# Patient Record
Sex: Male | Born: 1995 | Race: Black or African American | Hispanic: No | Marital: Single | Smoking: Never smoker
Health system: Southern US, Community
[De-identification: ages and names within clinical notes are randomized; demographics above are authoritative.]

---
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
## 2016-11-17 ENCOUNTER — Emergency Department: Payer: Self-pay

## 2016-11-17 ENCOUNTER — Emergency Department
Admission: EM | Admit: 2016-11-17 | Discharge: 2016-11-17 | Disposition: A | Discharge status: Home / Self Care | Payer: Self-pay | Attending: Emergency Medicine | Admitting: Emergency Medicine

## 2016-11-17 ENCOUNTER — Encounter: Payer: Self-pay | Admitting: Emergency Medicine

## 2016-11-17 DIAGNOSIS — J36 Peritonsillar abscess: Secondary | ICD-10-CM | POA: Insufficient documentation

## 2016-11-17 LAB — CBC WITH DIFFERENTIAL/PLATELET
BASOS PCT: 0 %
Basophils Absolute: 0 10*3/uL (ref 0–0.1)
EOS ABS: 0.2 10*3/uL (ref 0–0.7)
Eosinophils Relative: 1 %
HEMATOCRIT: 46.7 % (ref 40.0–52.0)
Hemoglobin: 15.8 g/dL (ref 13.0–18.0)
LYMPHS PCT: 18 %
Lymphs Abs: 2.8 10*3/uL (ref 1.0–3.6)
MCH: 29.2 pg (ref 26.0–34.0)
MCHC: 33.8 g/dL (ref 32.0–36.0)
MCV: 86.5 fL (ref 80.0–100.0)
MONO ABS: 2.7 10*3/uL — AB (ref 0.2–1.0)
Monocytes Relative: 17 %
NEUTROS ABS: 10.1 10*3/uL — AB (ref 1.4–6.5)
Neutrophils Relative %: 64 %
PLATELETS: 167 10*3/uL (ref 150–440)
RBC: 5.4 MIL/uL (ref 4.40–5.90)
RDW: 14 % (ref 11.5–14.5)
WBC: 15.8 10*3/uL — ABNORMAL HIGH (ref 3.8–10.6)

## 2016-11-17 LAB — POCT RAPID STREP A: STREPTOCOCCUS, GROUP A SCREEN (DIRECT): NEGATIVE

## 2016-11-17 LAB — BASIC METABOLIC PANEL
Anion gap: 12 (ref 5–15)
BUN: 13 mg/dL (ref 6–20)
CHLORIDE: 101 mmol/L (ref 101–111)
CO2: 24 mmol/L (ref 22–32)
Calcium: 9.3 mg/dL (ref 8.9–10.3)
Creatinine, Ser: 0.94 mg/dL (ref 0.61–1.24)
GFR calc non Af Amer: >60 mL/min (ref >60)
Glucose, Bld: 105 mg/dL — ABNORMAL HIGH (ref 65–99)
POTASSIUM: 3.4 mmol/L — AB (ref 3.5–5.1)
SODIUM: 137 mmol/L (ref 135–145)

## 2016-11-17 MED ORDER — CLINDAMYCIN PHOSPHATE 600 MG/50ML IV SOLN
INTRAVENOUS | Status: AC
  Administered 2016-11-17: 600 mg via INTRAVENOUS
  Filled 2016-11-17: qty 50

## 2016-11-17 MED ORDER — LIDOCAINE HCL (PF) 1 % IJ SOLN
INTRAMUSCULAR | Status: AC
  Administered 2016-11-17: 4.2 mL
  Filled 2016-11-17: qty 5

## 2016-11-17 MED ORDER — KETOROLAC TROMETHAMINE 30 MG/ML IJ SOLN
INTRAMUSCULAR | Status: AC
  Administered 2016-11-17: 30 mg via INTRAVENOUS
  Filled 2016-11-17: qty 1

## 2016-11-17 MED ORDER — METHYLPREDNISOLONE SODIUM SUCC 125 MG IJ SOLR
125.0000 mg | Freq: Once | INTRAMUSCULAR | Status: AC
  Administered 2016-11-17: 125 mg via INTRAVENOUS

## 2016-11-17 MED ORDER — METHYLPREDNISOLONE ACETATE 80 MG/ML IJ SUSP
80.0000 mg | Freq: Once | INTRAMUSCULAR | Status: AC
  Administered 2016-11-17: 80 mg via INTRAMUSCULAR
  Filled 2016-11-17 (×2): qty 1

## 2016-11-17 MED ORDER — PREDNISOLONE SODIUM PHOSPHATE 15 MG/5ML PO SOLN
60.0000 mg | Freq: Once | ORAL | Status: AC
  Administered 2016-11-17: 60 mg via ORAL
  Filled 2016-11-17: qty 20

## 2016-11-17 MED ORDER — CLINDAMYCIN PHOSPHATE 600 MG/50ML IV SOLN: 600.0000 mg | Freq: Once | INTRAVENOUS | Status: DC

## 2016-11-17 MED ORDER — SODIUM CHLORIDE 0.9 % IV BOLUS (SEPSIS)
1000.0000 mL | Freq: Once | INTRAVENOUS | Status: AC
  Administered 2016-11-17: 1000 mL via INTRAVENOUS

## 2016-11-17 MED ORDER — IOPAMIDOL (ISOVUE-300) INJECTION 61%
75.0000 mL | Freq: Once | INTRAVENOUS | Status: AC | PRN
  Administered 2016-11-17: 75 mL via INTRAVENOUS

## 2016-11-17 MED ORDER — KETOROLAC TROMETHAMINE 30 MG/ML IJ SOLN
30.0000 mg | Freq: Once | INTRAMUSCULAR | Status: AC
  Administered 2016-11-17: 30 mg via INTRAVENOUS

## 2016-11-17 MED ORDER — CEFTRIAXONE SODIUM 1 G IJ SOLR
2.0000 g | Freq: Once | INTRAMUSCULAR | Status: AC
  Administered 2016-11-17: 2 g via INTRAMUSCULAR
  Filled 2016-11-17: qty 20

## 2016-11-17 MED ORDER — METHYLPREDNISOLONE SODIUM SUCC 125 MG IJ SOLR
INTRAMUSCULAR | Status: AC
  Administered 2016-11-17: 125 mg via INTRAVENOUS
  Filled 2016-11-17: qty 2

## 2016-11-17 NOTE — ED Notes (Signed)
Kara Mead, RN notified that pt is needing IV for ct scan.

## 2016-11-17 NOTE — ED Notes (Signed)
Spoke with Dr. Malinda in regards to patients presentation. See orders. 

## 2016-11-17 NOTE — ED Notes (Signed)
Pt taken to room 10; report given to care nurse Jeannett Senior, RN; pt reports feeling some better now after medication; frozen italian ice given to pt and MD's request

## 2016-11-17 NOTE — ED Triage Notes (Signed)
Patient presents to ED via POV with c/o sore throat since Sunday. Patient is unable to swallow saliva in triage. Muffled voice noted. Low grade temp.

## 2016-11-17 NOTE — ED Provider Notes (Signed)
[facility] Emergency Department Provider Note   ____________________________________________   First MD Initiated Contact with Patient 11/17/16 2047     (approximate)  I have reviewed the triage vital signs and the nursing notes.   HISTORY  Chief Complaint Sore Throat    HPI Christian Wang is a 21 y.o. male with a sore throat since Sunday. He is unable to swallow his own spit today. His voice is somewhat muffled. .sorethroat is severe and worse when he tries to swallow.  History reviewed. No pertinent past medical history.  There are no active problems to display for this patient.   History reviewed. No pertinent surgical history.  Prior to Admission medications   Not on File    Allergies Patient has no known allergies.  No family history on file.  Social History Social History  Substance Use Topics  . Smoking status: Never Smoker  . Smokeless tobacco: Never Used  . Alcohol use No    Review of Systems  Constitutional: No fever/chills Eyes: No visual changes. ZOX:WRUE throat. Cardiovascular: Denies chest pain. Respiratory: Denies shortness of breath. Gastrointestinal: No abdominal pain.  No nausea, no vomiting.  No diarrhea.  No constipation. Genitourinary: Negative for dysuria. Musculoskeletal: Negative for back pain. Skin: Negative for rash. Neurological: Negative for headaches, focal weakness or numbness.   ____________________________________________   PHYSICAL EXAM:  VITAL SIGNS: ED Triage Vitals  Enc Vitals Group     BP 11/17/16 1748 (!) 142/82     Pulse Rate 11/17/16 1748 99     Resp 11/17/16 1748 20     Temp 11/17/16 1748 100.2 F (37.9 C)     Temp Source 11/17/16 1748 Oral     SpO2 11/17/16 1748 95 %     Weight 11/17/16 1748 210 lb (95.3 kg)     Height 11/17/16 1748  (1.702 m)     Head Circumference --      Peak Flow --      Pain Score 11/17/16 1754 8     Pain Loc --      Pain Edu? --      Excl.  in GC? --     Constitutional: Alert and oriented. Well appearing and in no acute distress. Eyes: Conjunctivae are normal. PERRL. EOMI. Head: Atraumatic. Nose: No congestion/rhinnorhea. Mouth/Throat: Mucous membranes are moist.  Red oral pharynx Neck: No stridor.   Cardiovascular: Normal rate, regular rhythm. Grossly normal heart sounds.  Good peripheral circulation. Respiratory: Normal respiratory effort.  No retractions. Lungs CTAB. Gastrointestinal: Soft and nontender. No distention. No abdominal bruits. No CVA tenderness. Musculoskeletal: No lower extremity tenderness nor edema.  No joint effusions.  ____________________________________________   LABS (all labs ordered are listed, but only abnormal results are displayed)  Labs Reviewed  BASIC METABOLIC PANEL - Abnormal; Notable for the following:       Result Value   Potassium 3.4 (*)    Glucose, Bld 105 (*)    All other components within normal limits  CBC WITH DIFFERENTIAL/PLATELET - Abnormal; Notable for the following:    WBC 15.8 (*)    Neutro Abs 10.1 (*)    Monocytes Absolute 2.7 (*)    All other components within normal limits  CULTURE, GROUP A STREP [facility])  POCT RAPID STREP A   ____________________________________________  EKG   ____________________________________________  RADIOLOGY  Study Result   CLINICAL DATA:  21 y/o  M; severe sore throat.  EXAM: CT NECK WITH CONTRAST  TECHNIQUE: Multidetector CT imaging  of the neck was performed using the standard protocol following the bolus administration of intravenous contrast.  CONTRAST:  75mL ISOVUE-300 IOPAMIDOL (ISOVUE-300) INJECTION 61%  COMPARISON:  None.  FINDINGS: Pharynx and larynx: Oropharyngeal mucosal thickening enlargement of the palatine and lingual tonsils compatible with pharyngitis. 15 x 10 x 7 mm lucent focus without peripheral enhancement centered within the left palatini tonsil (AP x ML x CC series 2, image 38 and series 7,  image 46) may represent a phlegmon or developing abscess.  Salivary glands: No inflammation, mass, or stone.  Thyroid: Normal.  Lymph nodes: Bilateral upper cervical lymphadenopathy is likely reactive.  Vascular: Negative.  Limited intracranial: Negative.  Visualized orbits: Negative.  Mastoids and visualized paranasal sinuses: Clear.  Skeleton: No acute or aggressive process.  Upper chest: Negative.  Other: None.  IMPRESSION: Oropharyngeal mucosal thickening with enlargement of the palatine and lingual tonsils compatible with pharyngitis. Lucency within the left palatine tonsil measuring up to 15 mm may represent a phlegmon or early abscess. Upper cervical lymphadenopathy is likely reactive.   Electronically Signed   By: Mitzi Hansen M.D.   On: 11/17/2016 19:23     ____________________________________________   PROCEDURES  Procedure(s) performed:   Procedures  Critical Care performed:   ____________________________________________   INITIAL IMPRESSION / ASSESSMENT AND PLAN / ED COURSE  Pertinent labs & imaging results that were available during my care of the patient were reviewed by me and considered in my medical decision making (see chart for details).   On discharge patient tolerating po fluids and popsicles well.      ____________________________________________   FINAL CLINICAL IMPRESSION(S) / ED DIAGNOSES  Final diagnoses:  Peritonsillar cellulitis      NEW MEDICATIONS STARTED DURING THIS VISIT:  New Prescriptions   No medications on file     Note:  This document was prepared using Dragon voice recognition software and may include unintentional dictation errors.    Arnaldo Natal, MD 11/17/16 2102

## 2016-11-17 NOTE — Discharge Instructions (Signed)
Return or see Dr Jenne Campus at [facility] ear nose and throat tomorrow unless you are a lot better. Take the steroids as directed to help with the swelling. Take the clindamycin antibiotic liquid 3 x a day to treat the infection and use liquid (children's ) motrin 8 tsp 3 x a day for the pain.

## 2016-11-20 LAB — CULTURE, GROUP A STREP (THRC)

## 2018-06-12 IMAGING — CT CT NECK W/ CM
4 of 5 series · 15 of 33 positions shown, 17 images · IV contrast (iopamidol)
Comparison: None.

CLINICAL DATA: 20 y/o  M; severe sore throat.

EXAM:
CT NECK WITH CONTRAST
TECHNIQUE: Multidetector CT imaging of the neck was performed using the
standard protocol following the bolus administration of intravenous
contrast.
CONTRAST:  75mL LBAK9M-EJJ IOPAMIDOL (LBAK9M-EJJ) INJECTION 61%

[Series 2: axial neck · axial · 0.58mm/px · z∈[+330,+476]mm · 4 of 123 slices shown, 5 images]
[im 25/123  soft-tissue]
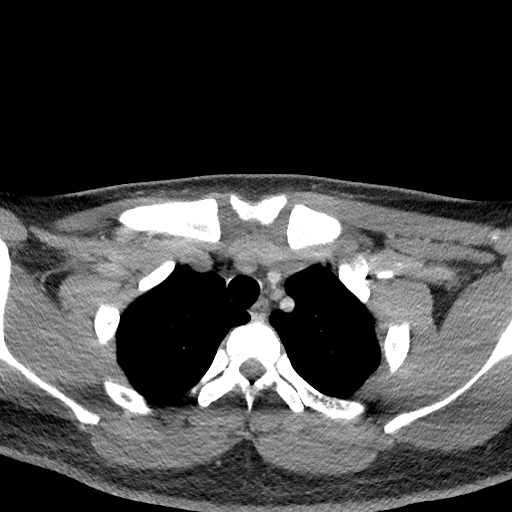
[im 25/123  bone]
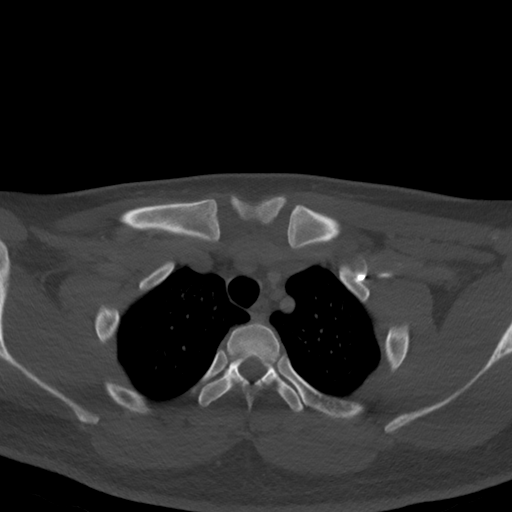
[im 49/123  bone]
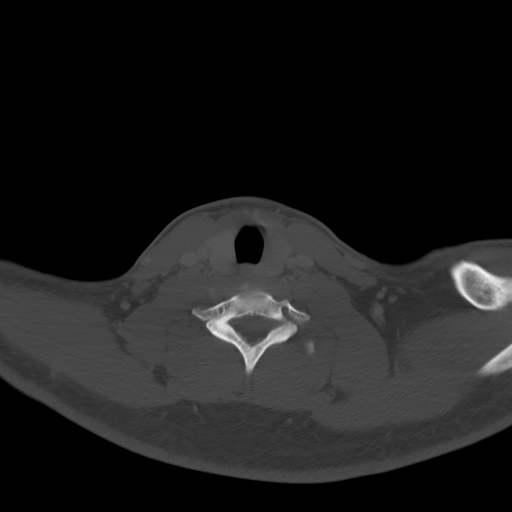
[im 74/123  bone]
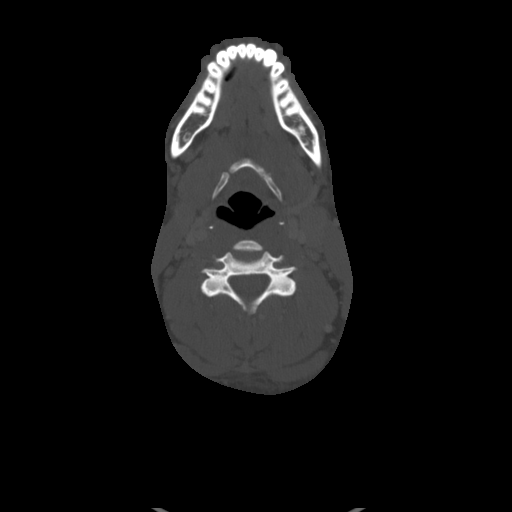
[im 98/123  bone]
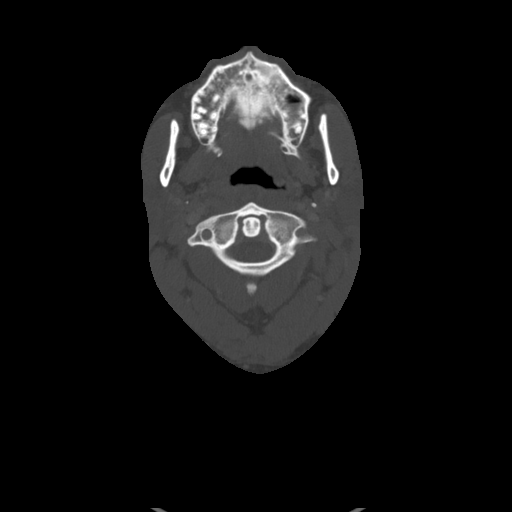

[Series 6: sag neck · sagittal · 0.51mm/px · 5 of 75 slices shown, 6 images]
[im 25/75  bone]
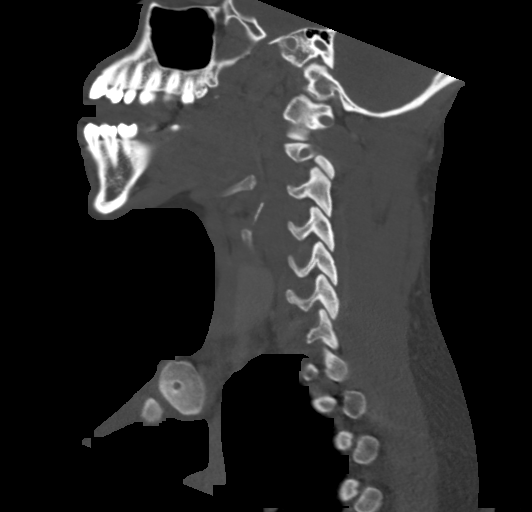
[im 31/75  bone]
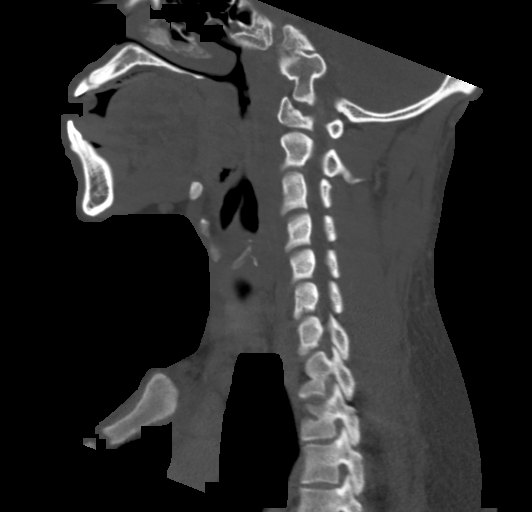
[im 38/75  soft-tissue]
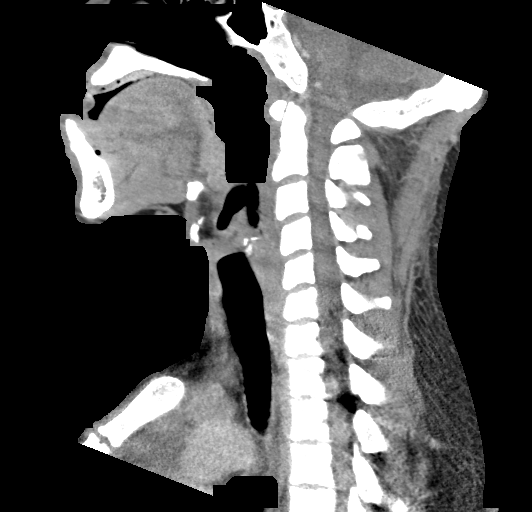
[im 38/75  bone]
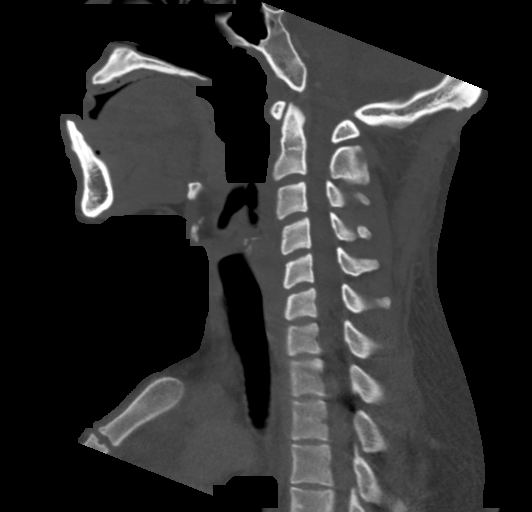
[im 44/75  bone]
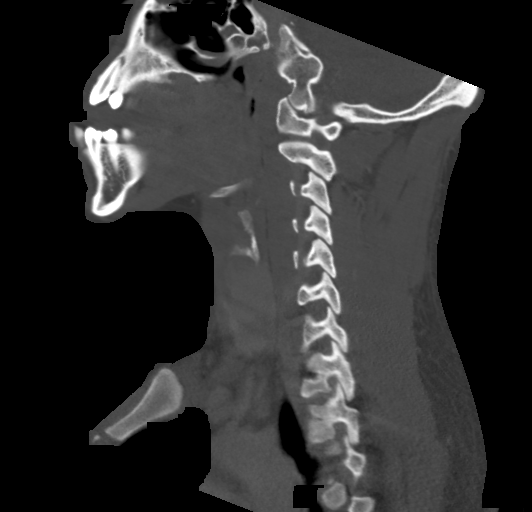
[im 50/75  bone]
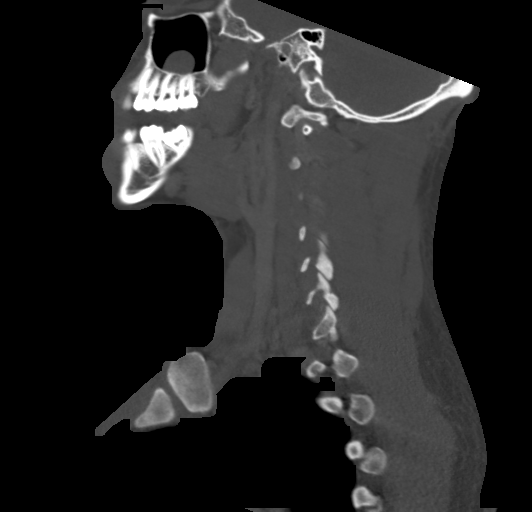

[Series 7: cor neck · coronal · 0.37mm/px · 3 of 111 slices shown]
[im 34/111  bone]
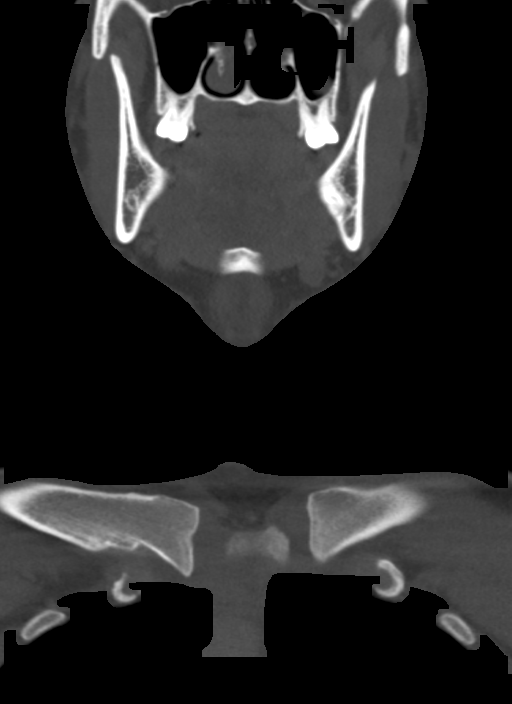
[im 48/111  bone]
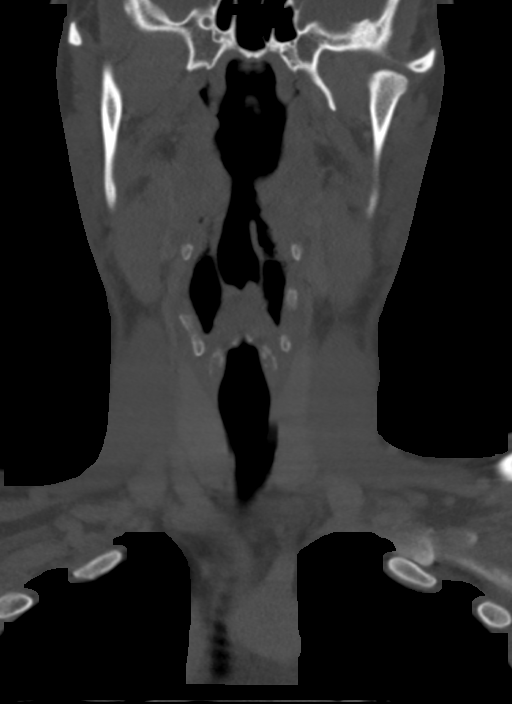
[im 63/111  bone]
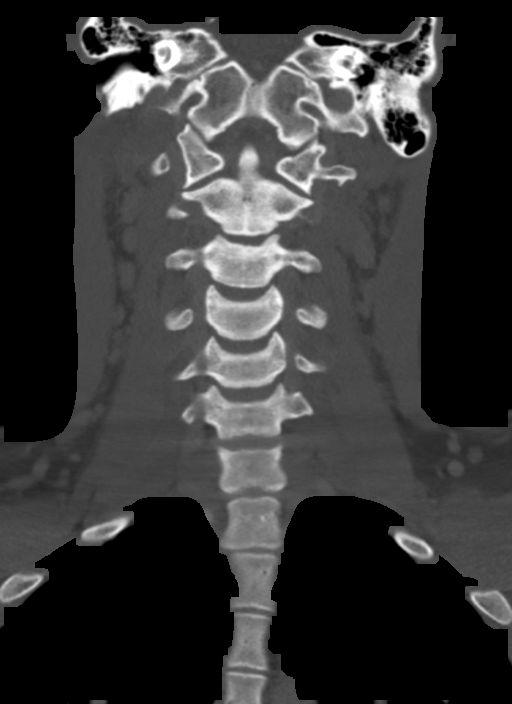

[Series 8: orthogonal ax · axial · 0.34mm/px · z∈[+294,+388]mm · 3 of 126 slices shown]
[im 26/126  bone]
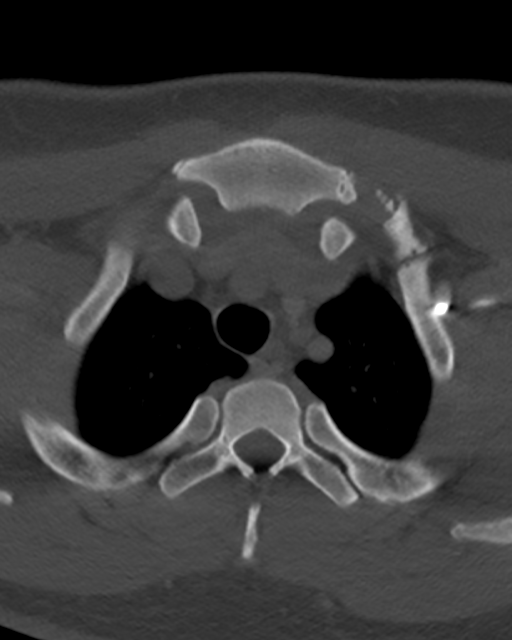
[im 51/126  bone]
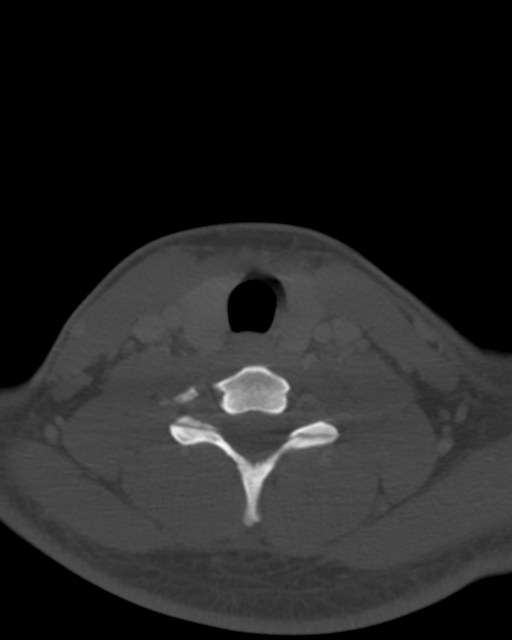
[im 76/126  bone]
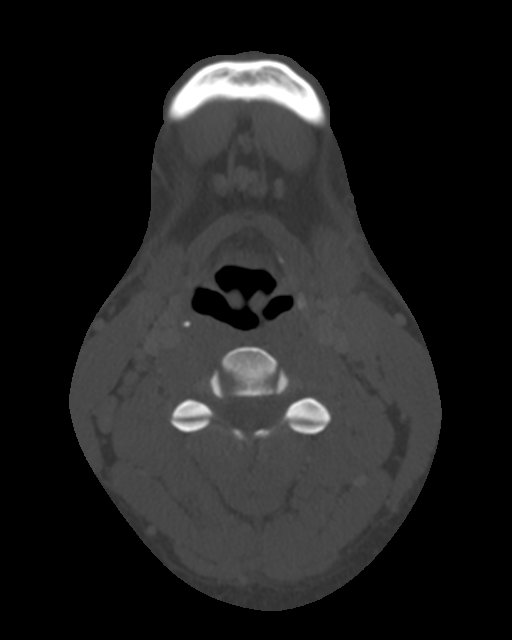

[15 of 33 positions shown; findings below may reference images not displayed]

FINDINGS: Pharynx and larynx: Oropharyngeal mucosal thickening enlargement of
the palatine and lingual tonsils compatible with pharyngitis. 15 x
10 x 7 mm lucent focus without peripheral enhancement centered
within the left palatini tonsil (AP x ML x CC series 2, image 38 and
series 7, image [DATE] represent a phlegmon or developing abscess.

Salivary glands: No inflammation, mass, or stone.

Thyroid: Normal.

Lymph nodes: Bilateral upper cervical lymphadenopathy is likely
reactive.

Vascular: Negative.

Limited intracranial: Negative.

Visualized orbits: Negative.

Mastoids and visualized paranasal sinuses: Clear.

Skeleton: No acute or aggressive process.

Upper chest: Negative.

Other: None.
IMPRESSION: Oropharyngeal mucosal thickening with enlargement of the palatine
and lingual tonsils compatible with pharyngitis. Lucency within the
left palatine tonsil measuring up to 15 mm may represent a phlegmon
or early abscess. Upper cervical lymphadenopathy is likely reactive.

By: Rantai Best M.D.

## 2018-10-07 ENCOUNTER — Emergency Department: Payer: No Typology Code available for payment source

## 2018-10-07 ENCOUNTER — Other Ambulatory Visit: Payer: Self-pay

## 2018-10-07 ENCOUNTER — Emergency Department
Admission: EM | Admit: 2018-10-07 | Discharge: 2018-10-07 | Disposition: A | Discharge status: Home / Self Care | Payer: No Typology Code available for payment source | Attending: Emergency Medicine | Admitting: Emergency Medicine

## 2018-10-07 DIAGNOSIS — Y9389 Activity, other specified: Secondary | ICD-10-CM | POA: Diagnosis not present

## 2018-10-07 DIAGNOSIS — S60212A Contusion of left wrist, initial encounter: Secondary | ICD-10-CM | POA: Diagnosis not present

## 2018-10-07 DIAGNOSIS — Y998 Other external cause status: Secondary | ICD-10-CM | POA: Diagnosis not present

## 2018-10-07 DIAGNOSIS — Y92410 Unspecified street and highway as the place of occurrence of the external cause: Secondary | ICD-10-CM | POA: Insufficient documentation

## 2018-10-07 DIAGNOSIS — S3992XA Unspecified injury of lower back, initial encounter: Secondary | ICD-10-CM | POA: Diagnosis present

## 2018-10-07 DIAGNOSIS — S39012A Strain of muscle, fascia and tendon of lower back, initial encounter: Secondary | ICD-10-CM | POA: Diagnosis not present

## 2018-10-07 MED ORDER — METHOCARBAMOL 500 MG PO TABS: 500.0000 mg | ORAL_TABLET | Freq: Four times a day (QID) | ORAL | 0 refills | Status: AC

## 2018-10-07 MED ORDER — MELOXICAM 15 MG PO TABS: 15.0000 mg | ORAL_TABLET | Freq: Every day | ORAL | 0 refills | Status: AC
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
## 2024-02-07 ENCOUNTER — Telehealth: Payer: Self-pay

## 2024-02-07 NOTE — Telephone Encounter (Signed)
 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 No previous records of positive HIV or positive syphilis found in [place] EDSS. Plasma CD reports faxed to DIS.

## 2024-02-08 NOTE — Telephone Encounter (Signed)
 Phone call to pt at [phone]. Received automated message stating no one was available to take call. Call disconnected. Unable to leave message. Called again, immediately received busy signal.

## 2024-02-08 NOTE — Telephone Encounter (Signed)
 Dama Byes, DIS, tried to call pt at number listed on plasma form, [phone]. Unable to leave voicemail message. She sent text to pt.

## 2024-02-09 NOTE — Telephone Encounter (Signed)
 Christian Wang, DIS, dropped off letter at pt's home today, 02/09/24.

## 2024-02-10 NOTE — Telephone Encounter (Signed)
 Phone call to pt at [phone], number listed on plasma CD report form. Voicemail not set up, unable to leave a message.

## 2024-02-15 NOTE — Telephone Encounter (Signed)
 02/15/24 Christian Wang, DIS, has not received a response from pt. Last week, Tiera left a phone message with person to have pt call her.

## 2024-02-22 NOTE — Telephone Encounter (Addendum)
 Phone call to pt at [phone]. Received message that voicemail not set up, try again later. Unable to leave message. Tried twice.

## 2024-02-22 NOTE — Telephone Encounter (Signed)
 As of 02/22/24, Dama Byes, DIS, has not had any response from pt.

## 2024-02-25 NOTE — Telephone Encounter (Signed)
 Christian Wang, DIS, met with pt on 02/25/24.  Pt scheduled for provider appt at [facility] on 03/02/24. Needs HIV and RPR testing.

## 2024-02-25 NOTE — Telephone Encounter (Signed)
 02/25/24 mailed generalized letter re important health matter. See scanned copy.

## 2024-03-02 ENCOUNTER — Ambulatory Visit

## 2024-03-14 NOTE — Telephone Encounter (Signed)
 Pt did not keep 03/02/24 [facility] appt.  Dama Byes, DIS, made another home visit 03/10/24. Was not able to make contact with pt. DIS has closed this event.
